# Patient Record
Sex: Female | Born: 1965 | Race: White | Hispanic: No | Marital: Single
Health system: Southern US, Community
[De-identification: ages and names within clinical notes are randomized; demographics above are authoritative.]

---

## 2021-05-13 ENCOUNTER — Telehealth: Payer: Self-pay

## 2021-05-13 NOTE — Telephone Encounter (Signed)
Pt contacted [facility], educational by phone to inquire about process of enrolling into the [facility]  Pt was interviewed regarding program requirements.  She  was found to complete requirements successfully.   An interview of past medical history and current medical needs were conducted.  Current Chief Medical Complaint (s)  Need to establish care and management medical conditions listed above;  Currently have lump behind LEFT Knee, Need evaluation of mental health and medications     Past and most recent medical conditions reported:   Anxiety, Bipolar I Depression with prescribed medications ( Prozac); Scolios of neck; herniated disc; osteoporsois; use marijuna with past prescription for medical use,    All required documents were reviewed to have available during their [facility] enrollment appointment.    Appt was scheduled for (Friday) May 16 2021 at 11am to complete enrollment at [facility]  A reminder text message was sent with appointment information and required documents to bring to appointment  Pt stated understanding and call ended.

## 2021-06-24 ENCOUNTER — Other Ambulatory Visit (HOSPITAL_COMMUNITY): Payer: Self-pay | Admitting: Family Medicine

## 2021-06-24 DIAGNOSIS — Z1231 Encounter for screening mammogram for malignant neoplasm of breast: Secondary | ICD-10-CM

## 2021-07-07 ENCOUNTER — Ambulatory Visit (HOSPITAL_COMMUNITY): Payer: Self-pay

## 2021-10-03 ENCOUNTER — Other Ambulatory Visit: Payer: Self-pay

## 2021-10-03 ENCOUNTER — Ambulatory Visit (HOSPITAL_COMMUNITY)
Admission: RE | Admit: 2021-10-03 | Discharge: 2021-10-03 | Disposition: A | Discharge status: Home / Self Care | Payer: Medicaid Other | Source: Ambulatory Visit | Attending: Family Medicine | Admitting: Family Medicine

## 2021-10-03 DIAGNOSIS — Z1231 Encounter for screening mammogram for malignant neoplasm of breast: Secondary | ICD-10-CM | POA: Insufficient documentation

## 2023-01-22 IMAGING — MG MM DIGITAL SCREENING BILAT W/ TOMO AND CAD
6 of 10 series · 6 of 30 positions shown · non-contrast
Comparison: None.

CLINICAL DATA: Screening.

EXAM:
DIGITAL SCREENING BILATERAL MAMMOGRAM WITH TOMOSYNTHESIS AND CAD
TECHNIQUE: Bilateral screening digital craniocaudal and mediolateral oblique
mammograms were obtained. Bilateral screening digital breast
tomosynthesis was performed. The images were evaluated with
computer-aided detection.

[L CC synth-2D]
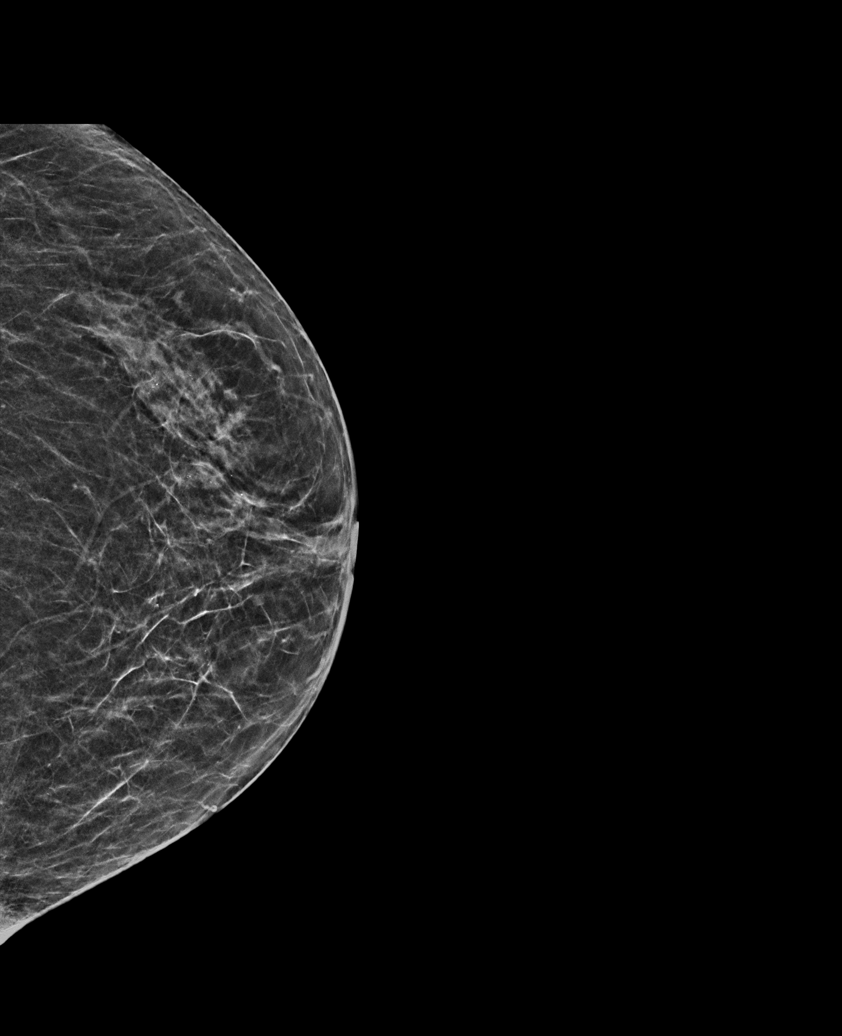

[R CC synth-2D]
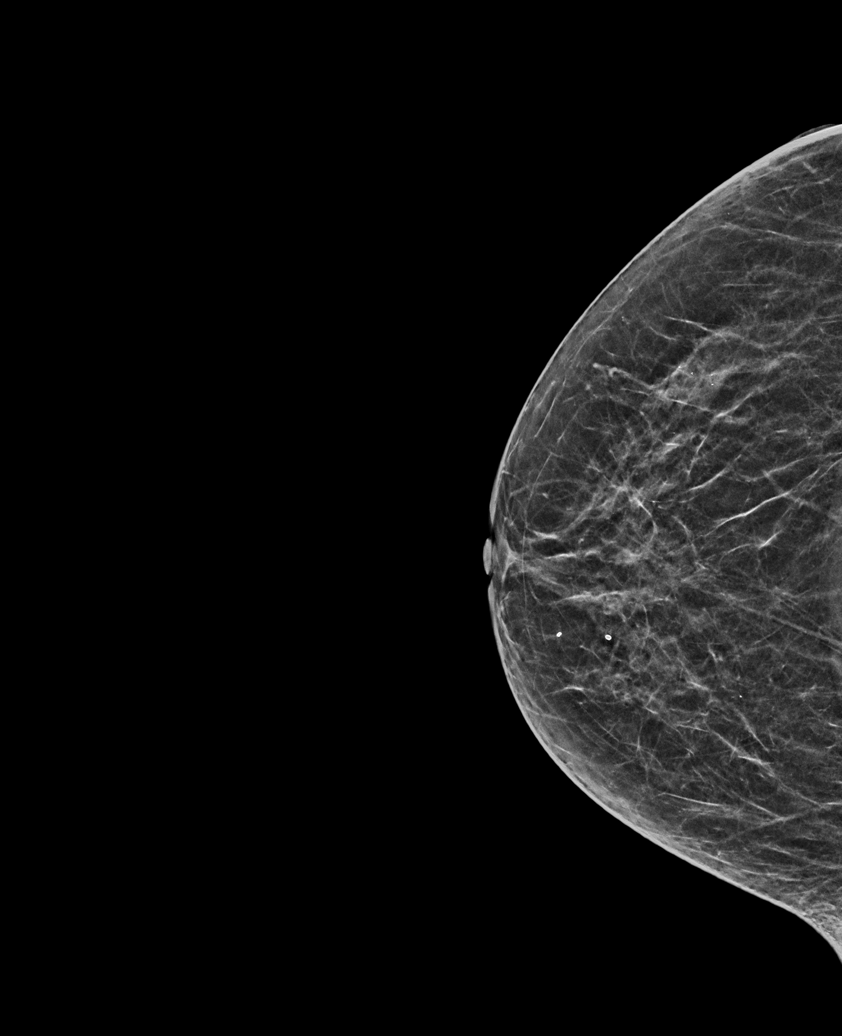

[R MLO synth-2D (1 of 2)]
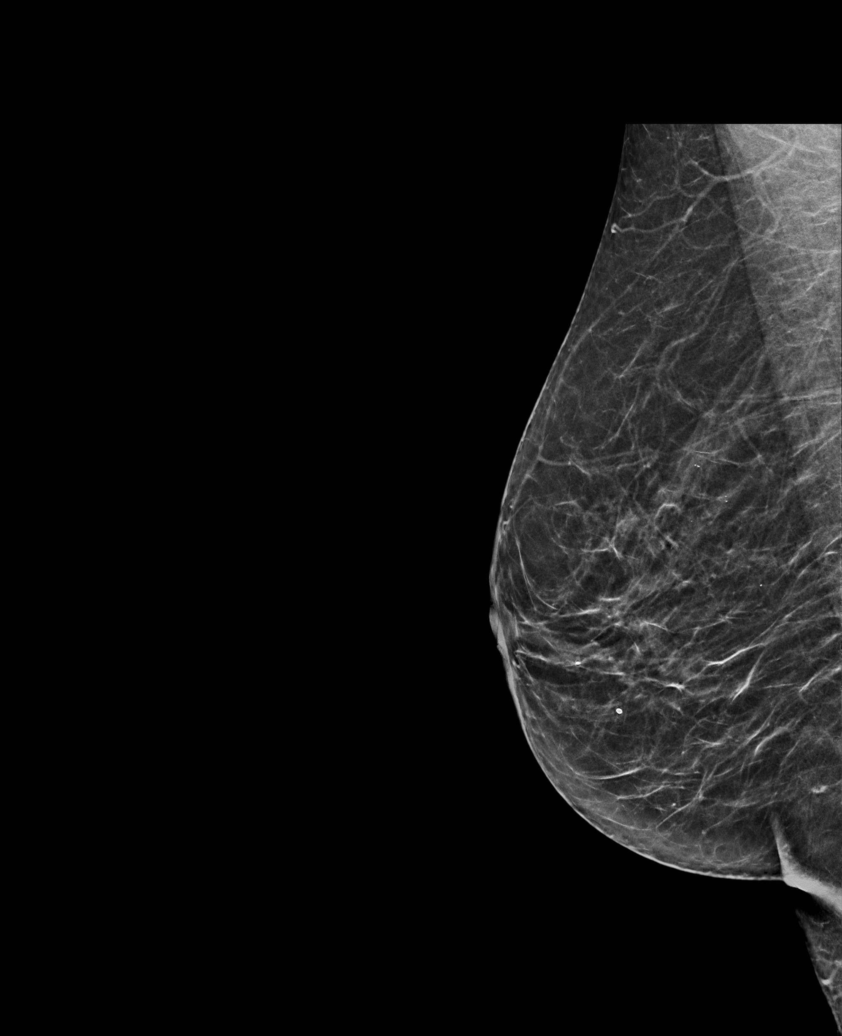

[L MLO synth-2D]
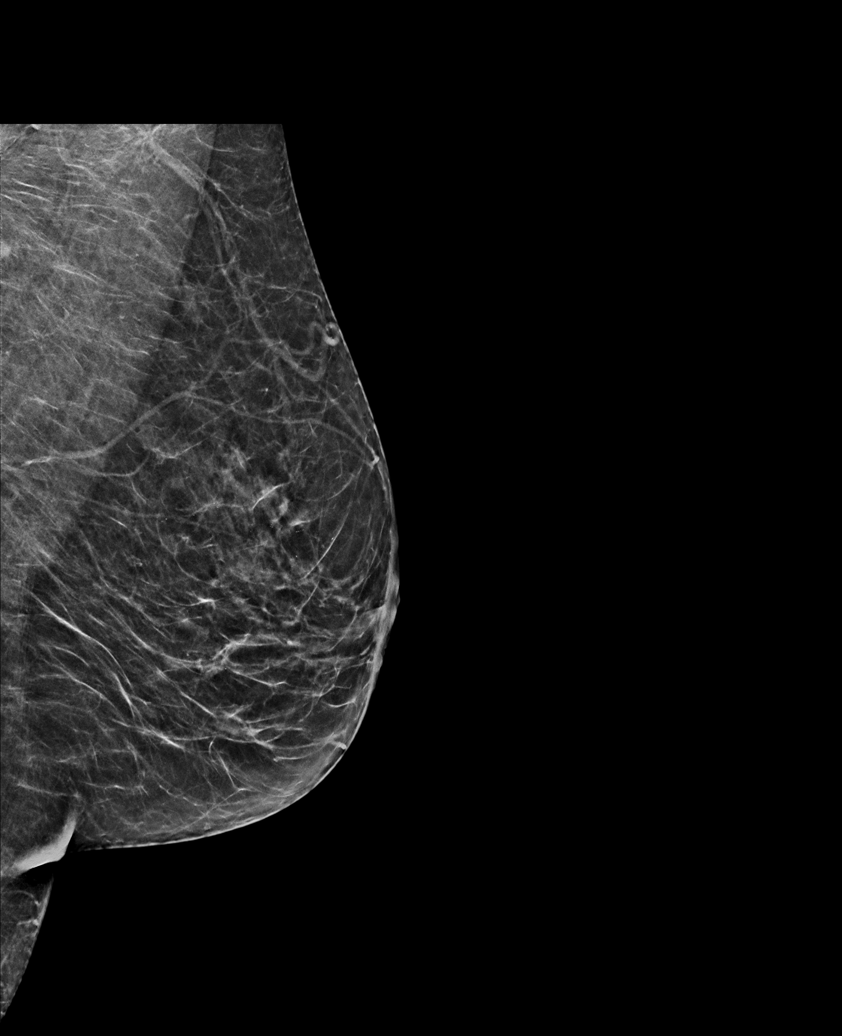

[R MLO synth-2D (2 of 2)]
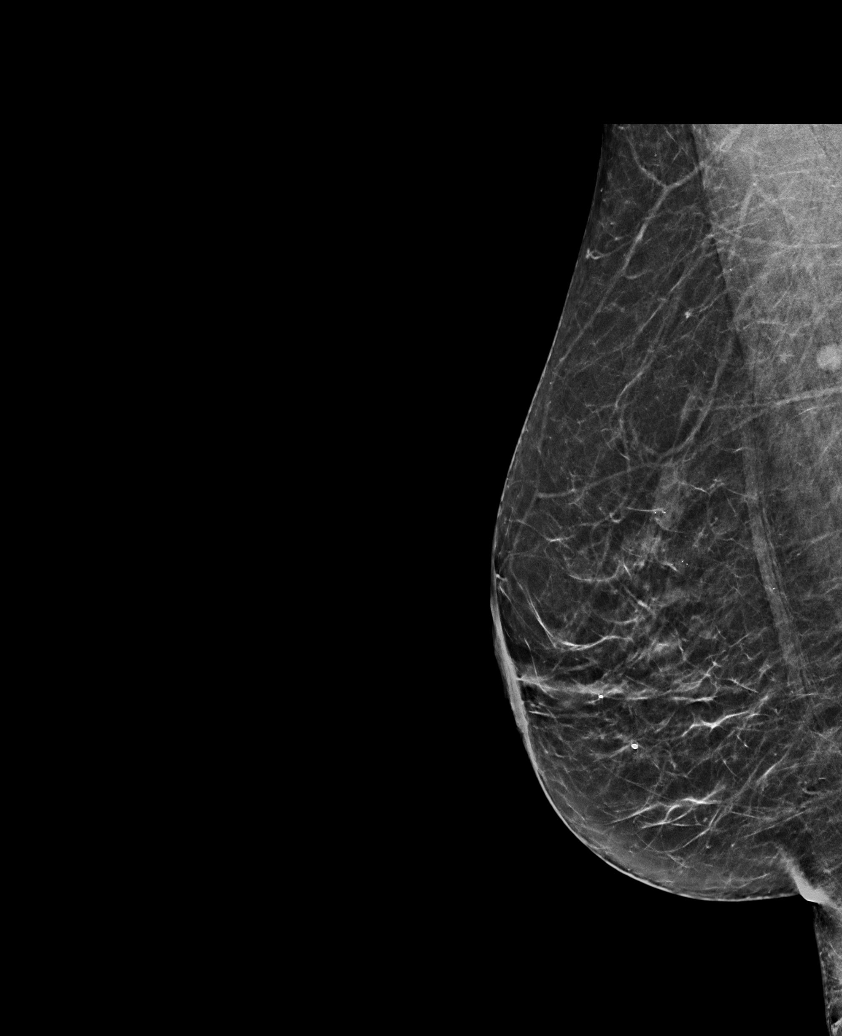

[R MLO tomo · tomo slice 29/58.0]
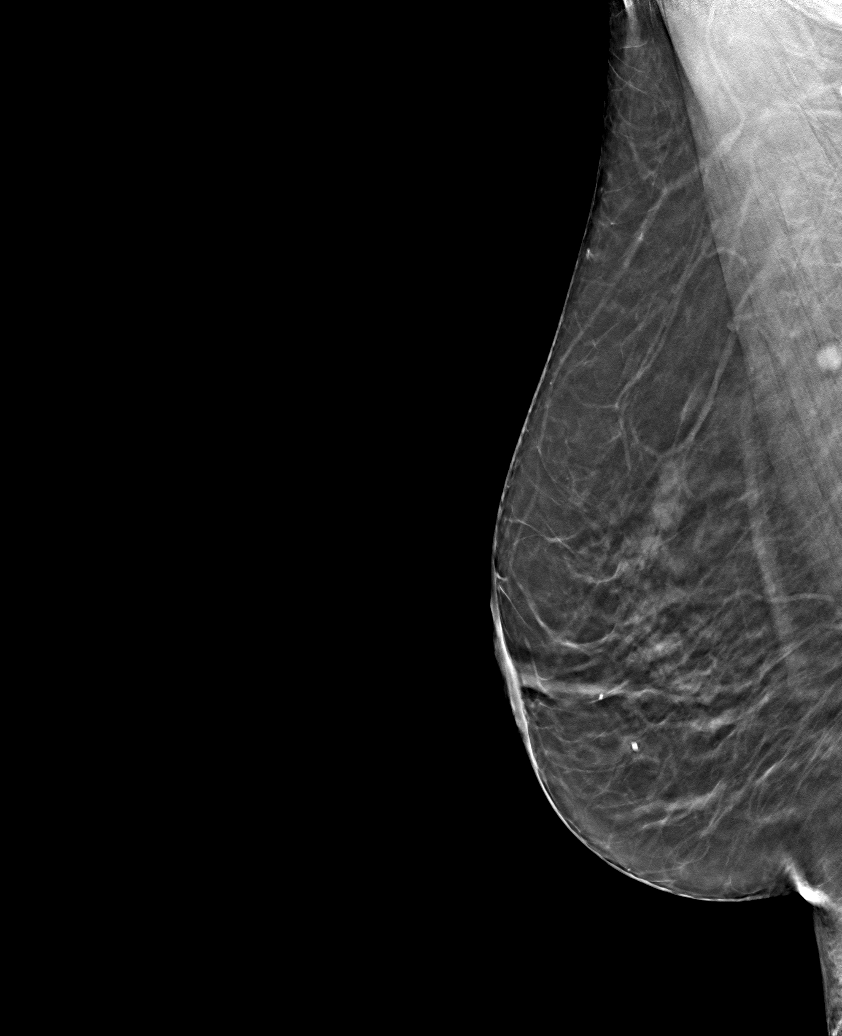

[6 of 30 positions shown; findings below may reference images not displayed]

ACR Breast Density Category b: There are scattered areas of
fibroglandular density.
FINDINGS: There are no findings suspicious for malignancy.
IMPRESSION: No mammographic evidence of malignancy. A result letter of this
screening mammogram will be mailed directly to the patient.

RECOMMENDATION:
Screening mammogram in one year. (Code:XG-X-X7B)

BI-RADS CATEGORY  1: Negative.

## 2023-11-30 DIAGNOSIS — D128 Benign neoplasm of rectum: Secondary | ICD-10-CM | POA: Diagnosis not present

## 2023-11-30 DIAGNOSIS — D126 Benign neoplasm of colon, unspecified: Secondary | ICD-10-CM | POA: Diagnosis not present

## 2023-12-13 DIAGNOSIS — Z79899 Other long term (current) drug therapy: Secondary | ICD-10-CM | POA: Diagnosis not present

## 2023-12-13 DIAGNOSIS — K648 Other hemorrhoids: Secondary | ICD-10-CM | POA: Diagnosis not present

## 2023-12-13 DIAGNOSIS — F17210 Nicotine dependence, cigarettes, uncomplicated: Secondary | ICD-10-CM | POA: Diagnosis not present

## 2023-12-13 DIAGNOSIS — Z860101 Personal history of adenomatous and serrated colon polyps: Secondary | ICD-10-CM | POA: Diagnosis not present

## 2023-12-13 DIAGNOSIS — K635 Polyp of colon: Secondary | ICD-10-CM | POA: Diagnosis not present

## 2023-12-13 DIAGNOSIS — E039 Hypothyroidism, unspecified: Secondary | ICD-10-CM | POA: Diagnosis not present

## 2023-12-13 DIAGNOSIS — D128 Benign neoplasm of rectum: Secondary | ICD-10-CM | POA: Diagnosis not present

## 2023-12-13 DIAGNOSIS — D125 Benign neoplasm of sigmoid colon: Secondary | ICD-10-CM | POA: Diagnosis not present

## 2023-12-13 DIAGNOSIS — K620 Anal polyp: Secondary | ICD-10-CM | POA: Diagnosis not present

## 2023-12-13 DIAGNOSIS — K621 Rectal polyp: Secondary | ICD-10-CM | POA: Diagnosis not present

## 2023-12-13 DIAGNOSIS — Z1211 Encounter for screening for malignant neoplasm of colon: Secondary | ICD-10-CM | POA: Diagnosis not present

## 2023-12-13 DIAGNOSIS — Z9081 Acquired absence of spleen: Secondary | ICD-10-CM | POA: Diagnosis not present

## 2023-12-16 DIAGNOSIS — Z1231 Encounter for screening mammogram for malignant neoplasm of breast: Secondary | ICD-10-CM | POA: Diagnosis not present

## 2023-12-21 DIAGNOSIS — D125 Benign neoplasm of sigmoid colon: Secondary | ICD-10-CM | POA: Diagnosis not present

## 2024-03-17 DIAGNOSIS — M79644 Pain in right finger(s): Secondary | ICD-10-CM | POA: Diagnosis not present

## 2024-05-12 DIAGNOSIS — M79672 Pain in left foot: Secondary | ICD-10-CM | POA: Diagnosis not present

## 2024-05-23 DIAGNOSIS — I10 Essential (primary) hypertension: Secondary | ICD-10-CM | POA: Diagnosis not present

## 2024-05-23 DIAGNOSIS — E063 Autoimmune thyroiditis: Secondary | ICD-10-CM | POA: Diagnosis not present

## 2024-07-12 DIAGNOSIS — Z79899 Other long term (current) drug therapy: Secondary | ICD-10-CM | POA: Diagnosis not present

## 2024-07-12 DIAGNOSIS — M542 Cervicalgia: Secondary | ICD-10-CM | POA: Diagnosis not present

## 2024-07-12 DIAGNOSIS — F419 Anxiety disorder, unspecified: Secondary | ICD-10-CM | POA: Diagnosis not present

## 2024-07-12 DIAGNOSIS — G629 Polyneuropathy, unspecified: Secondary | ICD-10-CM | POA: Diagnosis not present

## 2024-07-12 DIAGNOSIS — F332 Major depressive disorder, recurrent severe without psychotic features: Secondary | ICD-10-CM | POA: Diagnosis not present

## 2024-07-12 DIAGNOSIS — M62838 Other muscle spasm: Secondary | ICD-10-CM | POA: Diagnosis not present

## 2024-08-02 DIAGNOSIS — F17210 Nicotine dependence, cigarettes, uncomplicated: Secondary | ICD-10-CM | POA: Diagnosis not present

## 2024-08-02 DIAGNOSIS — R002 Palpitations: Secondary | ICD-10-CM | POA: Diagnosis not present

## 2024-08-02 DIAGNOSIS — F319 Bipolar disorder, unspecified: Secondary | ICD-10-CM | POA: Diagnosis not present

## 2024-08-02 DIAGNOSIS — Z008 Encounter for other general examination: Secondary | ICD-10-CM | POA: Diagnosis not present

## 2024-08-02 DIAGNOSIS — Z9181 History of falling: Secondary | ICD-10-CM | POA: Diagnosis not present

## 2024-08-02 DIAGNOSIS — F17290 Nicotine dependence, other tobacco product, uncomplicated: Secondary | ICD-10-CM | POA: Diagnosis not present

## 2024-08-02 DIAGNOSIS — E063 Autoimmune thyroiditis: Secondary | ICD-10-CM | POA: Diagnosis not present

## 2024-08-02 DIAGNOSIS — I10 Essential (primary) hypertension: Secondary | ICD-10-CM | POA: Diagnosis not present

## 2024-08-02 DIAGNOSIS — R269 Unspecified abnormalities of gait and mobility: Secondary | ICD-10-CM | POA: Diagnosis not present

## 2025-03-15 ENCOUNTER — Ambulatory Visit (HOSPITAL_COMMUNITY): Admitting: Registered Nurse
